# Patient Record
Sex: Female | Born: 1965 | Race: White | Hispanic: No | Smoking: Never smoker
Health system: Southern US, Community
[De-identification: ages and names within clinical notes are randomized; demographics above are authoritative.]

## PROBLEM LIST (undated history)

## (undated) DIAGNOSIS — F419 Anxiety disorder, unspecified: Secondary | ICD-10-CM

## (undated) DIAGNOSIS — I10 Essential (primary) hypertension: Secondary | ICD-10-CM

---

## 2005-12-16 ENCOUNTER — Emergency Department: Payer: Self-pay | Admitting: Emergency Medicine

## 2006-01-26 ENCOUNTER — Emergency Department: Payer: Self-pay | Admitting: Emergency Medicine

## 2007-02-01 ENCOUNTER — Inpatient Hospital Stay: Payer: Self-pay | Admitting: *Deleted

## 2011-11-12 ENCOUNTER — Ambulatory Visit: Payer: Self-pay

## 2017-04-05 ENCOUNTER — Encounter: Payer: Self-pay | Admitting: Emergency Medicine

## 2017-04-05 DIAGNOSIS — W1831XA Fall on same level due to stepping on an object, initial encounter: Secondary | ICD-10-CM | POA: Diagnosis not present

## 2017-04-05 DIAGNOSIS — S8991XA Unspecified injury of right lower leg, initial encounter: Secondary | ICD-10-CM | POA: Diagnosis present

## 2017-04-05 DIAGNOSIS — S8001XA Contusion of right knee, initial encounter: Secondary | ICD-10-CM | POA: Insufficient documentation

## 2017-04-05 DIAGNOSIS — Y999 Unspecified external cause status: Secondary | ICD-10-CM | POA: Diagnosis not present

## 2017-04-05 DIAGNOSIS — Y929 Unspecified place or not applicable: Secondary | ICD-10-CM | POA: Insufficient documentation

## 2017-04-05 DIAGNOSIS — Y939 Activity, unspecified: Secondary | ICD-10-CM | POA: Insufficient documentation

## 2017-04-05 NOTE — ED Triage Notes (Signed)
Pt to triage via WC, report stepped of curb, right leg gave out, and fell, now pain to right lower leg just below knee.  CSM intact, no deformity

## 2017-04-06 ENCOUNTER — Emergency Department
Admission: EM | Admit: 2017-04-06 | Discharge: 2017-04-06 | Disposition: A | Discharge status: Home / Self Care | Payer: BLUE CROSS/BLUE SHIELD | Attending: Emergency Medicine | Admitting: Emergency Medicine

## 2017-04-06 ENCOUNTER — Emergency Department: Payer: BLUE CROSS/BLUE SHIELD

## 2017-04-06 DIAGNOSIS — M25561 Pain in right knee: Secondary | ICD-10-CM

## 2017-04-06 DIAGNOSIS — S8001XA Contusion of right knee, initial encounter: Secondary | ICD-10-CM

## 2017-04-06 DIAGNOSIS — W19XXXA Unspecified fall, initial encounter: Secondary | ICD-10-CM

## 2017-04-06 MED ORDER — KETOROLAC TROMETHAMINE 30 MG/ML IJ SOLN
60.0000 mg | Freq: Once | INTRAMUSCULAR | Status: AC
  Administered 2017-04-06: 60 mg via INTRAMUSCULAR
  Filled 2017-04-06: qty 2

## 2017-04-06 MED ORDER — OXYCODONE-ACETAMINOPHEN 5-325 MG PO TABS
1.0000 | ORAL_TABLET | Freq: Once | ORAL | Status: AC
  Administered 2017-04-06: 1 via ORAL
  Filled 2017-04-06: qty 1

## 2017-04-06 MED ORDER — ONDANSETRON 4 MG PO TBDP
4.0000 mg | ORAL_TABLET | Freq: Once | ORAL | Status: AC
  Administered 2017-04-06: 4 mg via ORAL
  Filled 2017-04-06: qty 1

## 2017-04-06 MED ORDER — IBUPROFEN 800 MG PO TABS: 800.0000 mg | ORAL_TABLET | Freq: Three times a day (TID) | ORAL | 0 refills | Status: AC | PRN

## 2017-04-06 MED ORDER — OXYCODONE-ACETAMINOPHEN 5-325 MG PO TABS: 1.0000 | ORAL_TABLET | ORAL | 0 refills | Status: AC | PRN

## 2017-04-06 NOTE — Discharge Instructions (Signed)
1. You may take pain medicines as needed (Motrin/Percocet). 2. Elevated affected area and apply ice several times daily. 3. Wear knee immobilizer and use walker as needed. 4. Return to the ER for worsening symptoms, persistent vomiting, difficulty breathing or other concerns.

## 2017-04-06 NOTE — ED Provider Notes (Signed)
[facility] Emergency Department Provider Note   ____________________________________________   First MD Initiated Contact with Patient 04/06/17 0401     (approximate)  I have reviewed the triage vital signs and the nursing notes.   HISTORY  Chief Complaint Fall and Leg Pain (right)    HPI Donna Mccormick is a 51 y.o. female who presents to the ED from home with a chief complaint of right knee pain. Patient reports she stepped off a curb and fell onto her right knee around 4:30 PM. Complains of pain and swelling since. Denies extremity weakness, numbness or tingling. Denies use of anticoagulants. Denies striking head or LOC. Denies neck pain, chest pain, shortness of breath, abdominal pain, nausea, vomiting. Nothing makes her pain better. Movement and ambulation make her pain worse.   Past medical history None  There are no active problems to display for this patient.   History reviewed. No pertinent surgical history.  Prior to Admission medications   Not on File    Allergies Sulfa antibiotics  History reviewed. No pertinent family history.  Social History Social History  Substance Use Topics  . Smoking status: Never Smoker  . Smokeless tobacco: Never Used  . Alcohol use No    Review of Systems  Constitutional: No fever/chills. Eyes: No visual changes. ENT: No sore throat. Cardiovascular: Denies chest pain. Respiratory: Denies shortness of breath. Gastrointestinal: No abdominal pain.  No nausea, no vomiting.  No diarrhea.  No constipation. Genitourinary: Negative for dysuria. Musculoskeletal: Positive for right knee pain. Negative for back pain. Skin: Negative for rash. Neurological: Negative for headaches, focal weakness or numbness.   ____________________________________________   PHYSICAL EXAM:  VITAL SIGNS: ED Triage Vitals  Enc Vitals Group     BP 04/05/17 2351 128/85     Pulse Rate 04/05/17 2351 69     Resp  04/05/17 2351 18     Temp 04/05/17 2351 97.9 F (36.6 C)     Temp Source 04/05/17 2351 Oral     SpO2 04/05/17 2351 98 %     Weight 04/05/17 2352 179 lb (81.2 kg)     Height 04/05/17 2352 5\' 5"  (1.651 m)     Head Circumference --      Peak Flow --      Pain Score 04/05/17 2351 4     Pain Loc --      Pain Edu? --      Excl. in GC? --     Constitutional: Alert and oriented. Well appearing and in mild acute distress. Eyes: Conjunctivae are normal. PERRL. EOMI. Head: Atraumatic. Nose: No congestion/rhinnorhea. Mouth/Throat: Mucous membranes are moist.  Oropharynx non-erythematous. Neck: No stridor.  No cervical spine tenderness to palpation. Cardiovascular: Normal rate, regular rhythm. Grossly normal heart sounds.  Good peripheral circulation. Respiratory: Normal respiratory effort.  No retractions. Lungs CTAB. Gastrointestinal: Soft and nontender. No distention. No abdominal bruits. No CVA tenderness. Musculoskeletal: Abrasion and bruising to right anterior knee. Pain on range of motion. Mild effusion. Calf is supple without pain. 2+ femoral and distal pulses. Brisk, less than 5 second capillary refill. Symmetrically warm limb without evidence for ischemia.  Neurologic:  Normal speech and language. No gross focal neurologic deficits are appreciated.  Skin:  Skin is warm, dry and intact. No rash noted. Psychiatric: Mood and affect are normal. Speech and behavior are normal.  ____________________________________________   LABS (all labs ordered are listed, but only abnormal results are displayed)  Labs Reviewed - No data to  display ____________________________________________  EKG  None ____________________________________________  RADIOLOGY  Right tib-fib (view by me, interpreted per Dr. Clovis Riley): Negative.  Right knee x-rays interpreted per Dr. Clovis Riley: Negative for fracture, dislocation or radiopaque foreign body.  Possible small knee joint effusion.    ____________________________________________   PROCEDURES  Procedure(s) performed: None  Procedures  Critical Care performed: No  ____________________________________________   INITIAL IMPRESSION / ASSESSMENT AND PLAN / ED COURSE  Pertinent labs & imaging results that were available during my care of the patient were reviewed by me and considered in my medical decision making (see chart for details).  51 year old female who presents approximately 12 hours status post mechanical fall with right knee pain, swelling and ecchymosis. Will obtain knee x-rays, administer analgesia and reassess.  Clinical Course as of Apr 07 555  Fri Apr 06, 2017  1610 Updated patient on x-ray results of right knee. Will place a knee immobilizer, walker for ambulation and follow-up with orthopedics. Strict return precautions given. Patient verbalizes understanding and agrees with plan of care.  [JS]    Clinical Course User Index [JS] Irean Hong, MD     ____________________________________________   FINAL CLINICAL IMPRESSION(S) / ED DIAGNOSES  Final diagnoses:  Fall, initial encounter  Acute pain of right knee  Contusion of right knee, initial encounter      NEW MEDICATIONS STARTED DURING THIS VISIT:  New Prescriptions   No medications on file     Note:  This document was prepared using Dragon voice recognition software and may include unintentional dictation errors.    Irean Hong, MD 04/06/17 [phone]

## 2017-04-06 NOTE — ED Notes (Signed)
Pt returned from X-ray.  

## 2017-12-25 IMAGING — CR DG TIBIA/FIBULA 2V*R*
1 series · 2 of 2 positions shown · non-contrast
Comparison: None.

CLINICAL DATA: Persistent pain after fall tonight.

EXAM:
RIGHT TIBIA AND FIBULA - 2 VIEW

[Series 1: view not recorded · 0.14mm/px · 2 of 2 slices shown]
[im 1/2]
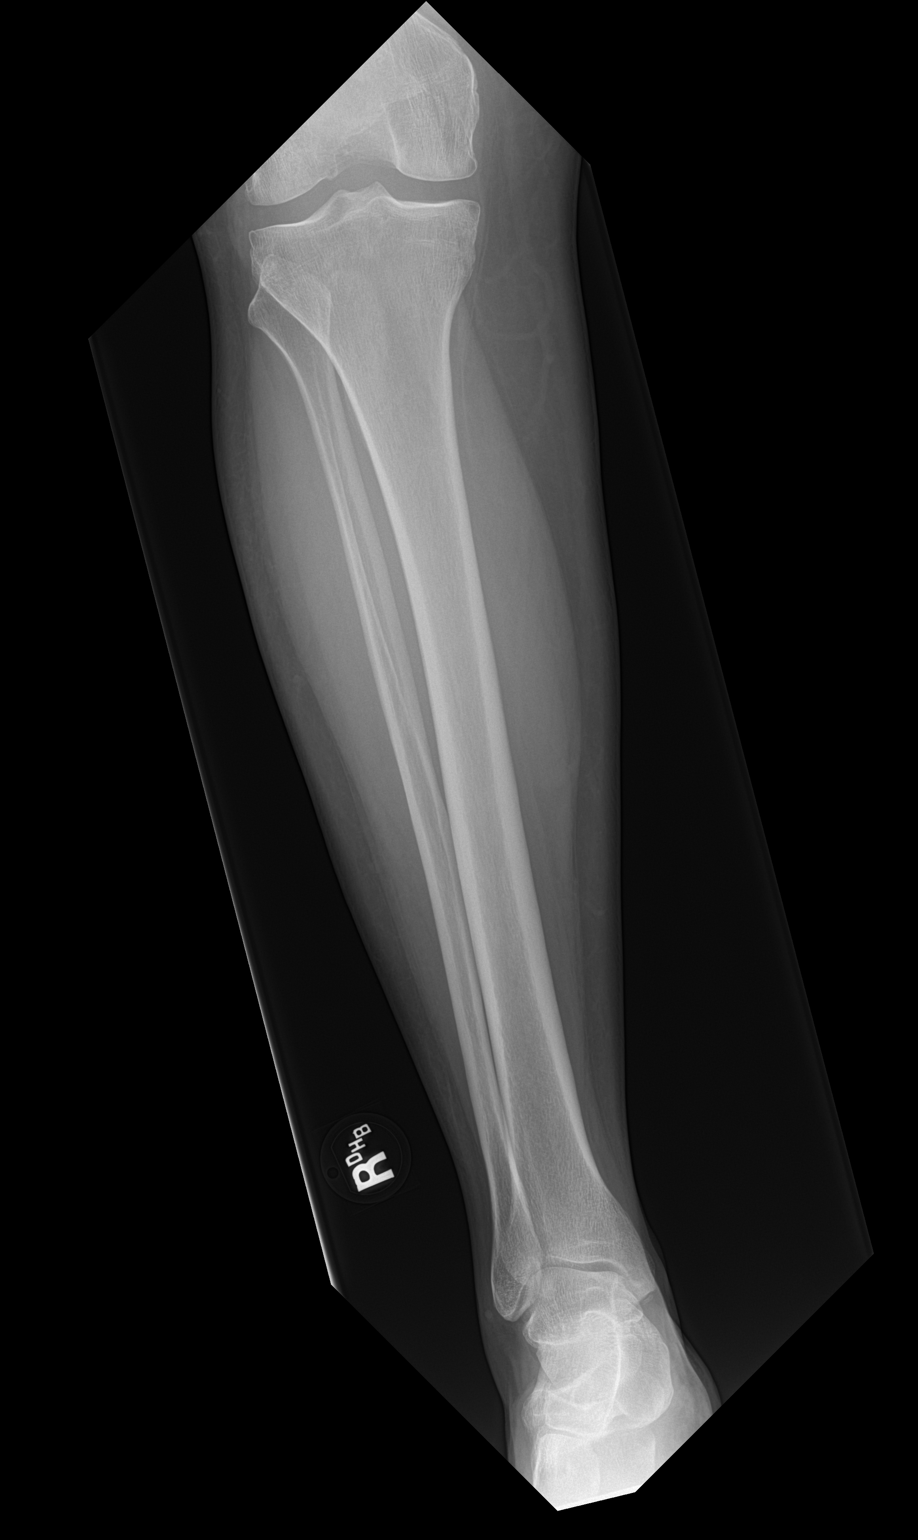
[im 2/2]
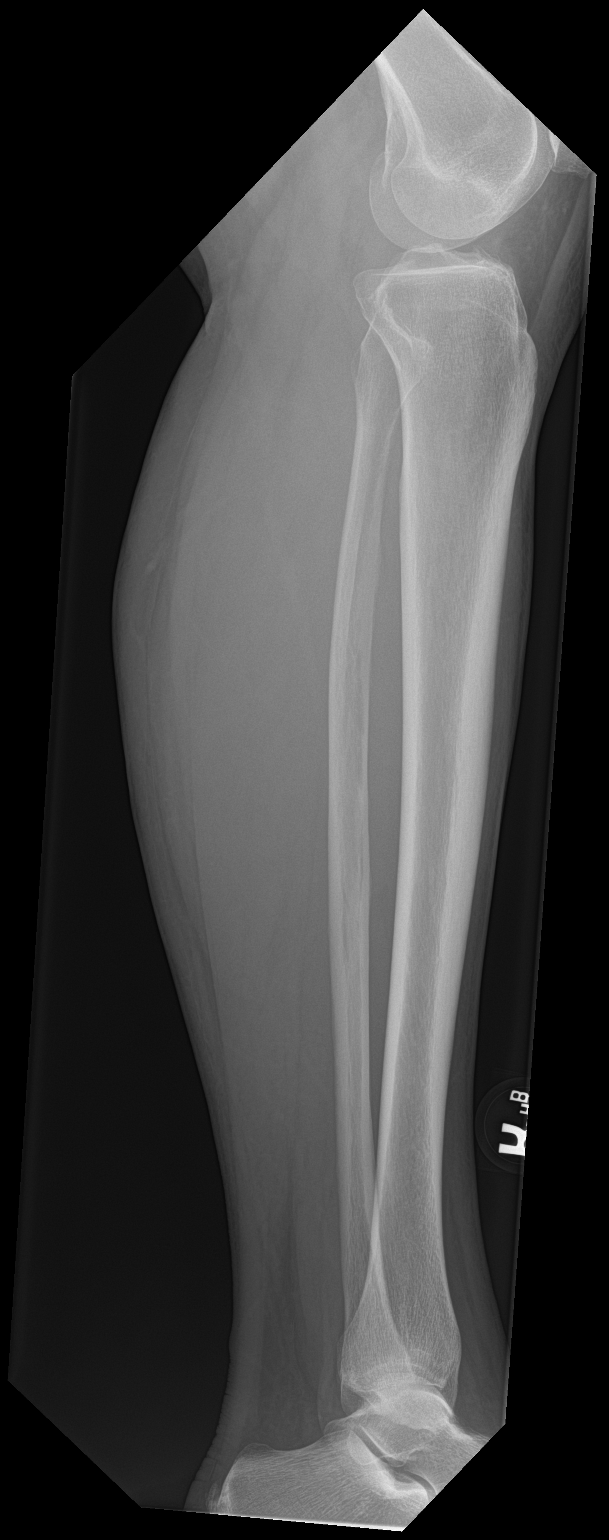

[2 of 2 positions shown; findings below may reference images not displayed]

FINDINGS: There is no evidence of fracture or other focal bone lesions. Soft
tissues are unremarkable.
IMPRESSION: Negative.

## 2019-10-03 ENCOUNTER — Other Ambulatory Visit: Payer: Self-pay

## 2019-10-03 ENCOUNTER — Ambulatory Visit
Admission: EM | Admit: 2019-10-03 | Discharge: 2019-10-03 | Disposition: A | Discharge status: Home / Self Care | Payer: 59 | Attending: Family Medicine | Admitting: Family Medicine

## 2019-10-03 ENCOUNTER — Encounter: Payer: Self-pay | Admitting: Emergency Medicine

## 2019-10-03 DIAGNOSIS — N390 Urinary tract infection, site not specified: Secondary | ICD-10-CM

## 2019-10-03 HISTORY — DX: Anxiety disorder, unspecified: F41.9

## 2019-10-03 LAB — URINALYSIS, COMPLETE (UACMP) WITH MICROSCOPIC
Bacteria, UA: NONE SEEN
Glucose, UA: NEGATIVE mg/dL
Ketones, ur: 15 mg/dL — AB
Leukocytes,Ua: NEGATIVE
Nitrite: NEGATIVE
Specific Gravity, Urine: 1.02 (ref 1.005–1.030)
pH: 6.5 (ref 5.0–8.0)

## 2019-10-03 MED ORDER — CEPHALEXIN 500 MG PO CAPS: 500.0000 mg | ORAL_CAPSULE | Freq: Two times a day (BID) | ORAL | 0 refills | Status: AC

## 2019-10-03 NOTE — ED Triage Notes (Signed)
Patient in office today c/o possible UTI abd. Pressure x2d Patient acknowledge she had left over amoxicliln and helped some that eased off some  Denies:freq, urgency,odor,discharge and burning   NHR:VACQPEA

## 2019-10-03 NOTE — Discharge Instructions (Signed)
Increase water intake Follow up with Primary Care Provider for recheck urine

## 2019-10-03 NOTE — ED Provider Notes (Signed)
[facility]    CSN: [id number] Arrival date & time: 10/03/19  0806      History   Chief Complaint Chief Complaint  Patient presents with  . Urinary Tract Infection    HPI Donna Mccormick is a 53 y.o. female.   53 yo female with a c/o possible uti with symptoms of low abdominal pressure and frequency associated with chills, fevers. States symptoms are similar to prior UTIs. Denies any vomiting. States she had some left over amoxicillin at home and took 2 doses yesterday.    Urinary Tract Infection   Past Medical History:  Diagnosis Date  . Anxiety     There are no active problems to display for this patient.   History reviewed. No pertinent surgical history.  OB History   No obstetric history on file.      Home Medications    Prior to Admission medications   Medication Sig Start Date End Date Taking? Authorizing Provider  ALPRAZolam Prudy Feeler) 0.25 MG tablet Take 0.25 mg by mouth at bedtime as needed for anxiety.   Yes [provider]  cephALEXin (KEFLEX) 500 MG capsule Take 1 capsule (500 mg total) by mouth 2 (two) times daily. 10/03/19   Payton Mccallum, MD  ibuprofen (ADVIL,MOTRIN) 800 MG tablet Take 1 tablet (800 mg total) by mouth every 8 (eight) hours as needed for moderate pain. 04/06/17   Irean Hong, MD  oxyCODONE-acetaminophen (ROXICET) 5-325 MG tablet Take 1 tablet by mouth every 4 (four) hours as needed for severe pain. 04/06/17   Irean Hong, MD    Family History No family history on file.  Social History Social History   Tobacco Use  . Smoking status: Never Smoker  . Smokeless tobacco: Never Used  Substance Use Topics  . Alcohol use: No  . Drug use: Not on file     Allergies   Sulfa antibiotics   Review of Systems Review of Systems   Physical Exam Triage Vital Signs ED Triage Vitals  Enc Vitals Group     BP 10/03/19 0821 134/87     Pulse Rate 10/03/19 0821 83     Resp 10/03/19 0821 18     Temp 10/03/19 0821  98.3 F (36.8 C)     Temp Source 10/03/19 0821 Oral     SpO2 10/03/19 0821 100 %     Weight 10/03/19 0822 197 lb (89.4 kg)     Height --      Head Circumference --      Peak Flow --      Pain Score 10/03/19 0822 0     Pain Loc --      Pain Edu? --      Excl. in GC? --    No data found.  Updated Vital Signs BP 134/87 (BP Location: Left Arm)   Pulse 83   Temp 98.3 F (36.8 C) (Oral)   Resp 18   Wt 89.4 kg   SpO2 100%   BMI 32.78 kg/m   Visual Acuity Right Eye Distance:   Left Eye Distance:   Bilateral Distance:    Right Eye Near:   Left Eye Near:    Bilateral Near:     Physical Exam Vitals signs and nursing note reviewed.  Constitutional:      General: She is not in acute distress.    Appearance: She is not toxic-appearing or diaphoretic.  Abdominal:     General: There is no distension.  Neurological:  Mental Status: She is alert.      UC Treatments / Results  Labs (all labs ordered are listed, but only abnormal results are displayed) Labs Reviewed  URINALYSIS, COMPLETE (UACMP) WITH MICROSCOPIC - Abnormal; Notable for the following components:      Result Value   Color, Urine AMBER (*)    APPearance HAZY (*)    Hgb urine dipstick SMALL (*)    Bilirubin Urine SMALL (*)    Ketones, ur 15 (*)    Protein, ur TRACE (*)    All other components within normal limits    EKG   Radiology No results found.  Procedures Procedures (including critical care time)  Medications Ordered in UC Medications - No data to display  Initial Impression / Assessment and Plan / UC Course  I have reviewed the triage vital signs and the nursing notes.  Pertinent labs & imaging results that were available during my care of the patient were reviewed by me and considered in my medical decision making (see chart for details).      Final Clinical Impressions(s) / UC Diagnoses   Final diagnoses:  Urinary tract infection without hematuria, site unspecified   Hyperbilirubinemia     Discharge Instructions     Increase water intake Follow up with Primary Care Provider for recheck urine    ED Prescriptions    Medication Sig Dispense Auth. Provider   cephALEXin (KEFLEX) 500 MG capsule Take 1 capsule (500 mg total) by mouth 2 (two) times daily. 14 capsule Norval Gable, MD      1. Lab results and diagnosis reviewed with patient 2. rx as per orders above; reviewed possible side effects, interactions, risks and benefits  3. Recommend supportive treatment as above 4. Follow-up prn if symptoms worsen or don't improve   PDMP not reviewed this encounter.   Norval Gable, MD 10/03/19 [phone]

## 2020-10-23 ENCOUNTER — Ambulatory Visit: Payer: 59

## 2020-10-23 NOTE — Progress Notes (Signed)
   Covid-19 Vaccination Clinic  Name:  Donna Mccormick    MRN: [id number] DOB: 1966/04/13  10/23/2020  [name] was observed post Covid-19 immunization for 15 minutes without incident. She was provided with Vaccine Information Sheet and instruction to access the V-Safe system.   [name] was instructed to call [phone] with any severe reactions post vaccine: Marland Kitchen Difficulty breathing  . Swelling of face and throat  . A fast heartbeat  . A bad rash all over body  . Dizziness and weakness

## 2021-02-12 ENCOUNTER — Ambulatory Visit
Admission: EM | Admit: 2021-02-12 | Discharge: 2021-02-12 | Disposition: A | Discharge status: Home / Self Care | Payer: PRIVATE HEALTH INSURANCE | Attending: Sports Medicine | Admitting: Sports Medicine

## 2021-02-12 ENCOUNTER — Encounter: Payer: Self-pay | Admitting: Emergency Medicine

## 2021-02-12 ENCOUNTER — Other Ambulatory Visit: Payer: Self-pay

## 2021-02-12 DIAGNOSIS — N390 Urinary tract infection, site not specified: Secondary | ICD-10-CM

## 2021-02-12 DIAGNOSIS — R3915 Urgency of urination: Secondary | ICD-10-CM | POA: Diagnosis present

## 2021-02-12 DIAGNOSIS — R350 Frequency of micturition: Secondary | ICD-10-CM

## 2021-02-12 DIAGNOSIS — R519 Headache, unspecified: Secondary | ICD-10-CM | POA: Diagnosis not present

## 2021-02-12 HISTORY — DX: Essential (primary) hypertension: I10

## 2021-02-12 LAB — URINALYSIS, COMPLETE (UACMP) WITH MICROSCOPIC
Specific Gravity, Urine: 1.02 (ref 1.005–1.030)
pH: 6 (ref 5.0–8.0)

## 2021-02-12 MED ORDER — NITROFURANTOIN MONOHYD MACRO 100 MG PO CAPS: 100.0000 mg | ORAL_CAPSULE | Freq: Two times a day (BID) | ORAL | 0 refills | Status: AC

## 2021-02-12 NOTE — ED Provider Notes (Signed)
[facility]    CSN: [id number] Arrival date & time: 02/12/21  0825      History   Chief Complaint Chief Complaint  Patient presents with  . Abdominal Pain  . Headache    HPI Donna Mccormick is a 55 y.o. female.   Patient is a pleasant 55 year old female who presents for evaluation of 2 days of bladder pressure.  She denies any significant pain or hematuria.  She says that she has increased urgency increased frequency decreased volume with voiding.  She has had UTIs in the past but nothing in at least 6 months.  She has had a little bit of nausea but no vomiting or diarrhea.  She says she felt a little bit warm with some chills but no documented fever.  She has been using Tylenol and Azo.  Denies chest pain, shortness of breath, no flank pain.  She does report a headache but does have some chronic migraines.  No vision changes, no balance issues, no jaw pain.  No red flag signs or symptoms elicited on history.     Past Medical History:  Diagnosis Date  . Anxiety   . Hypertension     There are no problems to display for this patient.   History reviewed. No pertinent surgical history.  OB History   No obstetric history on file.      Home Medications    Prior to Admission medications   Medication Sig Start Date End Date Taking? Authorizing Provider  ALPRAZolam Prudy Feeler) 0.25 MG tablet Take 0.25 mg by mouth at bedtime as needed for anxiety.   Yes [provider]  losartan-hydrochlorothiazide (HYZAAR) 50-12.5 MG tablet Take 1 tablet by mouth daily. 01/30/21  Yes [provider]  nitrofurantoin, macrocrystal-monohydrate, (MACROBID) 100 MG capsule Take 1 capsule (100 mg total) by mouth 2 (two) times daily. 02/12/21  Yes Delton See, MD  cephALEXin (KEFLEX) 500 MG capsule Take 1 capsule (500 mg total) by mouth 2 (two) times daily. 10/03/19   Payton Mccallum, MD  ibuprofen (ADVIL,MOTRIN) 800 MG tablet Take 1 tablet (800 mg total) by mouth every 8  (eight) hours as needed for moderate pain. 04/06/17   Irean Hong, MD  oxyCODONE-acetaminophen (ROXICET) 5-325 MG tablet Take 1 tablet by mouth every 4 (four) hours as needed for severe pain. 04/06/17   Irean Hong, MD    Family History History reviewed. No pertinent family history.  Social History Social History   Tobacco Use  . Smoking status: Never Smoker  . Smokeless tobacco: Never Used  Vaping Use  . Vaping Use: Never used  Substance Use Topics  . Alcohol use: No  . Drug use: Never     Allergies   Sulfa antibiotics   Review of Systems Review of Systems  Constitutional: Positive for chills and diaphoresis. Negative for activity change, appetite change, fatigue and fever.  HENT: Negative.   Eyes: Negative.   Respiratory: Negative.  Negative for cough and shortness of breath.   Cardiovascular: Negative.  Negative for chest pain and palpitations.  Gastrointestinal: Positive for abdominal pain and nausea. Negative for blood in stool, constipation, diarrhea and vomiting.  Genitourinary: Positive for dysuria, frequency and urgency. Negative for flank pain, hematuria, pelvic pain, vaginal bleeding, vaginal discharge and vaginal pain.  Musculoskeletal: Negative for back pain, myalgias and neck pain.  Skin: Negative for color change, rash and wound.  Neurological: Positive for headaches. Negative for dizziness, seizures, syncope, weakness, light-headedness and numbness.  All other systems  reviewed and are negative.    Physical Exam Triage Vital Signs ED Triage Vitals  Enc Vitals Group     BP 02/12/21 0836 134/88     Pulse Rate 02/12/21 0836 75     Resp 02/12/21 0836 14     Temp 02/12/21 0836 98.4 F (36.9 C)     Temp Source 02/12/21 0836 Oral     SpO2 02/12/21 0836 98 %     Weight 02/12/21 0834 197 lb 1.5 oz (89.4 kg)     Height 02/12/21 0834 5\' 5"  (1.651 m)     Head Circumference --      Peak Flow --      Pain Score 02/12/21 0834 3     Pain Loc --      Pain Edu? --       Excl. in GC? --    No data found.  Updated Vital Signs BP 134/88 (BP Location: Left Arm)   Pulse 75   Temp 98.4 F (36.9 C) (Oral)   Resp 14   Ht 5\' 5"  (1.651 m)   Wt 89.4 kg   SpO2 98%   BMI 32.80 kg/m   Visual Acuity Right Eye Distance:   Left Eye Distance:   Bilateral Distance:    Right Eye Near:   Left Eye Near:    Bilateral Near:     Physical Exam Vitals and nursing note reviewed.  Constitutional:      General: She is not in acute distress.    Appearance: She is well-developed. She is not ill-appearing, toxic-appearing or diaphoretic.  HENT:     Head: Normocephalic and atraumatic.     Mouth/Throat:     Mouth: Mucous membranes are moist.     Pharynx: Oropharynx is clear.  Eyes:     Extraocular Movements: Extraocular movements intact.     Pupils: Pupils are equal, round, and reactive to light.  Cardiovascular:     Rate and Rhythm: Normal rate and regular rhythm.     Heart sounds: No murmur heard. No friction rub. No gallop.   Pulmonary:     Effort: Pulmonary effort is normal. No respiratory distress.     Breath sounds: Normal breath sounds. No stridor. No wheezing, rhonchi or rales.  Abdominal:     General: Abdomen is flat. Bowel sounds are normal. There are no signs of injury.     Palpations: Abdomen is soft. There is no hepatomegaly or splenomegaly.     Tenderness: There is abdominal tenderness in the suprapubic area. There is no right CVA tenderness, left CVA tenderness, guarding or rebound.  Skin:    General: Skin is warm and dry.     Capillary Refill: Capillary refill takes less than 2 seconds.  Neurological:     General: No focal deficit present.     Mental Status: She is alert.      UC Treatments / Results  Labs (all labs ordered are listed, but only abnormal results are displayed) Labs Reviewed  URINALYSIS, COMPLETE (UACMP) WITH MICROSCOPIC - Abnormal; Notable for the following components:      Result Value   Color, Urine ORANGE (*)     APPearance HAZY (*)    Glucose, UA   (*)    Value: TEST NOT REPORTED DUE TO COLOR INTERFERENCE OF URINE PIGMENT   Hgb urine dipstick   (*)    Value: TEST NOT REPORTED DUE TO COLOR INTERFERENCE OF URINE PIGMENT   Bilirubin Urine   (*)  Value: TEST NOT REPORTED DUE TO COLOR INTERFERENCE OF URINE PIGMENT   Ketones, ur   (*)    Value: TEST NOT REPORTED DUE TO COLOR INTERFERENCE OF URINE PIGMENT   Protein, ur   (*)    Value: TEST NOT REPORTED DUE TO COLOR INTERFERENCE OF URINE PIGMENT   Nitrite   (*)    Value: TEST NOT REPORTED DUE TO COLOR INTERFERENCE OF URINE PIGMENT   Leukocytes,Ua   (*)    Value: TEST NOT REPORTED DUE TO COLOR INTERFERENCE OF URINE PIGMENT   Bacteria, UA MANY (*)    All other components within normal limits  URINE CULTURE    EKG   Radiology No results found.  Procedures Procedures (including critical care time)  Medications Ordered in UC Medications - No data to display  Initial Impression / Assessment and Plan / UC Course  I have reviewed the triage vital signs and the nursing notes.  Pertinent labs & imaging results that were available during my care of the patient were reviewed by me and considered in my medical decision making (see chart for details).  Clinical impression: Pain with urination with increased frequency and urgency and decreased volume with each void for 2 to 3 days.  Treatment plan: 1.  The findings and treatment plan were discussed in detail with the patient.  Patient was in agreement. 2.  We will go ahead and get a UA.  Results show many bacteria.  Unfortunately, patient took Azo which makes the remainder of the test uninterpretable.  Given the bacteria, will treat for UTI.  Sending off for culture.  We will empirically treat with Macrobid.  If her results show resistant organism so will contact her to change her antibiotic. 3.  Educational handout provided. 4.  Over-the-counter meds as needed. 5.  Advised to flush her system  with plenty of water.  She can use Azo as well for any discomfort.  Also cranberry juice was advised. 6.  Did not need a work note. 7.  Follow-up here as needed.     Final Clinical Impressions(s) / UC Diagnoses   Final diagnoses:  Lower urinary tract infectious disease  Urinary frequency  Urinary urgency  Acute nonintractable headache, unspecified headache type     Discharge Instructions     Your urine indicates you have a urinary tract infection.  I sent an antibiotic to your pharmacy.  I have also sent off the culture to ensure that there is no resistance to that antibiotic.  I gave you some educational handouts.  Please read them at your leisure.  I want you to hydrate and flush her system with plenty of water.  You can also use Azo and cranberry juice.  Over-the-counter medications for any discomfort.  If you develop any significant fever or flank pain please go to the emergency room.  I hope you get the feeling better, Dr. Zachery Dauer    ED Prescriptions    Medication Sig Dispense Auth. Provider   nitrofurantoin, macrocrystal-monohydrate, (MACROBID) 100 MG capsule Take 1 capsule (100 mg total) by mouth 2 (two) times daily. 10 capsule Delton See, MD     PDMP not reviewed this encounter.   Delton See, MD 02/12/21 1324

## 2021-02-12 NOTE — Discharge Instructions (Addendum)
Your urine indicates you have a urinary tract infection.  I sent an antibiotic to your pharmacy.  I have also sent off the culture to ensure that there is no resistance to that antibiotic.  I gave you some educational handouts.  Please read them at your leisure.  I want you to hydrate and flush her system with plenty of water.  You can also use Azo and cranberry juice.  Over-the-counter medications for any discomfort.  If you develop any significant fever or flank pain please go to the emergency room.  I hope you get the feeling better, Dr. Sueanne Maniaci 

## 2021-02-12 NOTE — ED Triage Notes (Signed)
Patient c/o headache that started on Monday.  Patient states that she has history of migraines.  Patient states that she started having lower abdominal pain and pressure that started yesterday. Patient denies fevers.  Patient reports chills.  Patient has been taking AZO.  Patient denies N/V/D.

## 2021-02-13 LAB — URINE CULTURE: Culture: NO GROWTH
